# Patient Record
Sex: Female | Born: 1985 | Race: Black or African American | Hispanic: No | Marital: Single | Smoking: Never smoker
Health system: Southern US, Community
[De-identification: ages and names within clinical notes are randomized; demographics above are authoritative.]

## PROBLEM LIST (undated history)

## (undated) HISTORY — PX: TONSILLECTOMY: SUR1361

---

## 2004-02-05 ENCOUNTER — Emergency Department (HOSPITAL_COMMUNITY): Admission: EM | Admit: 2004-02-05 | Discharge: 2004-02-05 | Payer: Self-pay | Admitting: Emergency Medicine

## 2004-07-02 ENCOUNTER — Emergency Department (HOSPITAL_COMMUNITY): Admission: EM | Admit: 2004-07-02 | Discharge: 2004-07-02 | Payer: Self-pay | Admitting: Emergency Medicine

## 2004-08-26 ENCOUNTER — Emergency Department (HOSPITAL_COMMUNITY): Admission: EM | Admit: 2004-08-26 | Discharge: 2004-08-26 | Payer: Self-pay | Admitting: Emergency Medicine

## 2005-10-02 ENCOUNTER — Emergency Department (HOSPITAL_COMMUNITY): Admission: EM | Admit: 2005-10-02 | Discharge: 2005-10-02 | Payer: Self-pay | Admitting: Emergency Medicine

## 2005-10-13 ENCOUNTER — Emergency Department (HOSPITAL_COMMUNITY): Admission: EM | Admit: 2005-10-13 | Discharge: 2005-10-13 | Payer: Self-pay | Admitting: Emergency Medicine

## 2006-03-07 ENCOUNTER — Emergency Department (HOSPITAL_COMMUNITY): Admission: EM | Admit: 2006-03-07 | Discharge: 2006-03-08 | Payer: Self-pay | Admitting: Emergency Medicine

## 2008-08-29 ENCOUNTER — Emergency Department (HOSPITAL_BASED_OUTPATIENT_CLINIC_OR_DEPARTMENT_OTHER): Admission: EM | Admit: 2008-08-29 | Discharge: 2008-08-29 | Payer: Self-pay | Admitting: Emergency Medicine

## 2009-03-06 ENCOUNTER — Emergency Department (HOSPITAL_COMMUNITY): Admission: EM | Admit: 2009-03-06 | Discharge: 2009-03-06 | Payer: Self-pay | Admitting: Emergency Medicine

## 2010-04-02 LAB — POCT PREGNANCY, URINE: Preg Test, Ur: NEGATIVE

## 2010-04-19 LAB — URINALYSIS, ROUTINE W REFLEX MICROSCOPIC
Bilirubin Urine: NEGATIVE
Ketones, ur: 15 mg/dL — AB
Nitrite: NEGATIVE
Protein, ur: 30 mg/dL — AB
Urobilinogen, UA: 1 mg/dL (ref 0.0–1.0)

## 2010-04-19 LAB — URINE MICROSCOPIC-ADD ON

## 2010-04-19 LAB — PREGNANCY, URINE: Preg Test, Ur: NEGATIVE

## 2010-11-17 ENCOUNTER — Emergency Department (INDEPENDENT_AMBULATORY_CARE_PROVIDER_SITE_OTHER): Payer: Medicaid Other

## 2010-11-17 ENCOUNTER — Encounter: Payer: Self-pay | Admitting: *Deleted

## 2010-11-17 ENCOUNTER — Emergency Department (HOSPITAL_BASED_OUTPATIENT_CLINIC_OR_DEPARTMENT_OTHER)
Admission: EM | Admit: 2010-11-17 | Discharge: 2010-11-17 | Disposition: A | Discharge status: Home / Self Care | Payer: Medicaid Other | Attending: Emergency Medicine | Admitting: Emergency Medicine

## 2010-11-17 DIAGNOSIS — W19XXXA Unspecified fall, initial encounter: Secondary | ICD-10-CM

## 2010-11-17 DIAGNOSIS — M546 Pain in thoracic spine: Secondary | ICD-10-CM

## 2010-11-17 DIAGNOSIS — M4120 Other idiopathic scoliosis, site unspecified: Secondary | ICD-10-CM

## 2010-11-17 DIAGNOSIS — M542 Cervicalgia: Secondary | ICD-10-CM

## 2010-11-17 DIAGNOSIS — Y92009 Unspecified place in unspecified non-institutional (private) residence as the place of occurrence of the external cause: Secondary | ICD-10-CM | POA: Insufficient documentation

## 2010-11-17 DIAGNOSIS — S139XXA Sprain of joints and ligaments of unspecified parts of neck, initial encounter: Secondary | ICD-10-CM | POA: Insufficient documentation

## 2010-11-17 DIAGNOSIS — S161XXA Strain of muscle, fascia and tendon at neck level, initial encounter: Secondary | ICD-10-CM

## 2010-11-17 DIAGNOSIS — W108XXA Fall (on) (from) other stairs and steps, initial encounter: Secondary | ICD-10-CM | POA: Insufficient documentation

## 2010-11-17 DIAGNOSIS — M549 Dorsalgia, unspecified: Secondary | ICD-10-CM | POA: Insufficient documentation

## 2010-11-17 MED ORDER — HYDROCODONE-ACETAMINOPHEN 5-500 MG PO TABS: 1.0000 | ORAL_TABLET | Freq: Four times a day (QID) | ORAL | Status: AC | PRN

## 2010-11-17 MED ORDER — CYCLOBENZAPRINE HCL 5 MG PO TABS: 5.0000 mg | ORAL_TABLET | Freq: Two times a day (BID) | ORAL | Status: AC | PRN

## 2010-11-17 MED ORDER — HYDROCODONE-ACETAMINOPHEN 5-325 MG PO TABS
2.0000 | ORAL_TABLET | Freq: Once | ORAL | Status: AC
  Administered 2010-11-17: 2 via ORAL
  Filled 2010-11-17: qty 2

## 2010-11-17 NOTE — ED Provider Notes (Signed)
History     CSN: [id number] Arrival date & time: 11/17/2010  2:39 PM   First MD Initiated Contact with Patient 11/17/10 1452      Chief Complaint  Patient presents with  . Fall  . Back Pain    (Consider location/radiation/quality/duration/timing/severity/associated sxs/prior treatment) Patient is a 25 y.o. female presenting with fall and back pain. The history is provided by the patient. No language interpreter was used.  Fall The accident occurred 3 to 5 hours ago. Incident: fall down flight of stairs. She landed on a hard floor. There was no blood loss. Pain location: upper back. The pain is moderate. She was ambulatory at the scene. There was no entrapment after the fall. There was no drug use involved in the accident. There was no alcohol use involved in the accident. Pertinent negatives include no loss of consciousness and no tingling. The symptoms are aggravated by activity.  Back Pain  Pertinent negatives include no tingling.    History reviewed. No pertinent past medical history.  Past Surgical History  Procedure Date  . Tonsillectomy   . Cesarean section     History reviewed. No pertinent family history.  History  Substance Use Topics  . Smoking status: Never Smoker   . Smokeless tobacco: Not on file  . Alcohol Use: No    OB History    Grav Para Term Preterm Abortions TAB SAB Ect Mult Living                  Review of Systems  Musculoskeletal: Positive for back pain.  Neurological: Negative for tingling and loss of consciousness.  All other systems reviewed and are negative.    Allergies  Review of patient's allergies indicates no known allergies.  Home Medications  No current outpatient prescriptions on file.  BP 150/71  Pulse 82  Temp(Src) 98.1 F (36.7 C) (Oral)  Resp 18  Ht 5\' 4"  (1.626 m)  Wt 230 lb (104.327 kg)  BMI 39.48 kg/m2  SpO2 100%  Physical Exam  Nursing note and vitals reviewed. Constitutional: She is oriented to person,  place, and time. She appears well-developed and well-nourished.  HENT:  Head: Normocephalic and atraumatic.  Right Ear: External ear normal.  Left Ear: External ear normal.  Eyes: Pupils are equal, round, and reactive to light.  Neck: Neck supple.  Cardiovascular: Normal rate and regular rhythm.   Pulmonary/Chest: Effort normal and breath sounds normal.  Abdominal: Soft.  Musculoskeletal:       Cervical back: She exhibits tenderness, bony tenderness and pain.       Thoracic back: She exhibits tenderness and bony tenderness.       Lumbar back: Normal.  Neurological: She is alert and oriented to person, place, and time.  Skin: Skin is warm and dry.  Psychiatric: She has a normal mood and affect.    ED Course  Procedures (including critical care time)  Labs Reviewed - No data to display Dg Cervical Spine Complete  11/17/2010  *RADIOLOGY REPORT*  Clinical Data: 25 year old female status post fall with pain.  CERVICAL SPINE - COMPLETE 4+ VIEW  Comparison: 07/02/2004.  Findings: Chronic straightening of cervical lordosis.  Normal prevertebral soft tissues.  Relatively preserved disc spaces. Cervicothoracic junction alignment is within normal limits. Bilateral posterior element alignment is within normal limits. Stable AP alignment with mild scoliosis.  Lung apices are clear. C1-C2 alignment and odontoid within normal limits.  IMPRESSION: No acute fracture or listhesis identified in the cervical spine. Ligamentous injury  is not excluded.  Original Report Authenticated By: Harley Hallmark, M.D.   Dg Thoracic Spine 4v  11/17/2010  *RADIOLOGY REPORT*  Clinical Data: 25 year old female status post fall with pain.  THORACIC SPINE - 4+ VIEW  Comparison: Chest radiographs 10/13/2005.  Findings: Moderate to s-shaped scoliosis. Bone mineralization is within normal limits.  Normal thoracic segmentation. Cervicothoracic junction alignment is within normal limits.  Stable thoracic vertebral height and  alignment.  Grossly intact posterior ribs.  Grossly negative visualized thoracic visceral contours.  IMPRESSION: Moderate scoliosis. No acute fracture or listhesis identified in the thoracic spine.  Original Report Authenticated By: Harley Hallmark, M.D.     1. Cervical strain   2. Back pain   3. Fall       MDM  Pt not having any neuro deficit:pt to follow up as needed:will treat symptomatically        Teressa Lower, NP 11/17/10 1624

## 2010-11-17 NOTE — ED Notes (Signed)
Pt c/o fall this am injuring upper back

## 2010-11-17 NOTE — ED Provider Notes (Signed)
Medical screening examination/treatment/procedure(s) were performed by non-physician practitioner and as supervising physician I was immediately available for consultation/collaboration.   Edilson Vital, MD 11/17/10 2149 

## 2012-05-15 IMAGING — CR DG THORACIC SPINE 4+V
3 series · 3 of 3 positions shown · non-contrast
Comparison: Chest radiographs 10/13/2005.

CLINICAL DATA: 24-year-old female status post fall with pain.

THORACIC SPINE - 4+ VIEW

[w t-spine a.p. *]
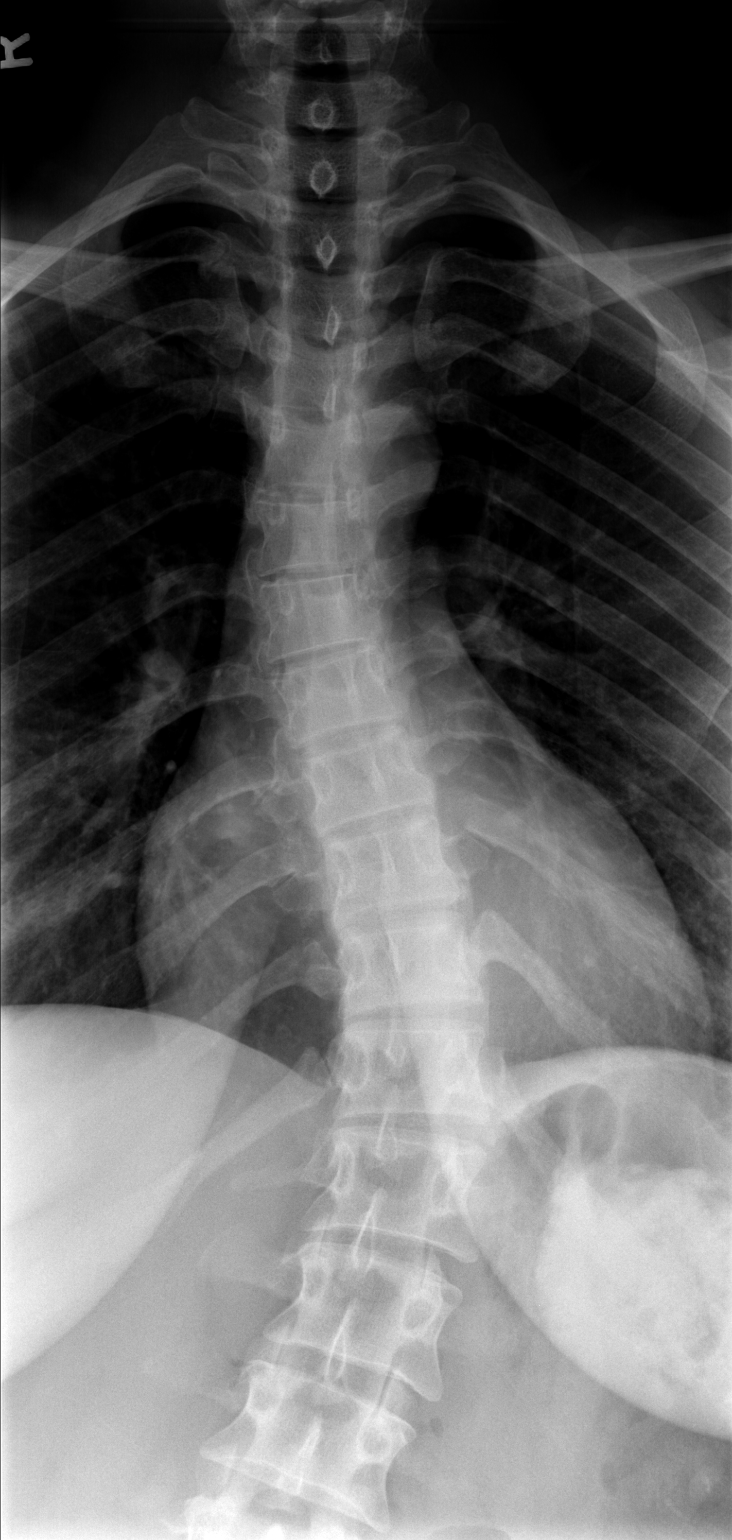

[w t-spine lat *]
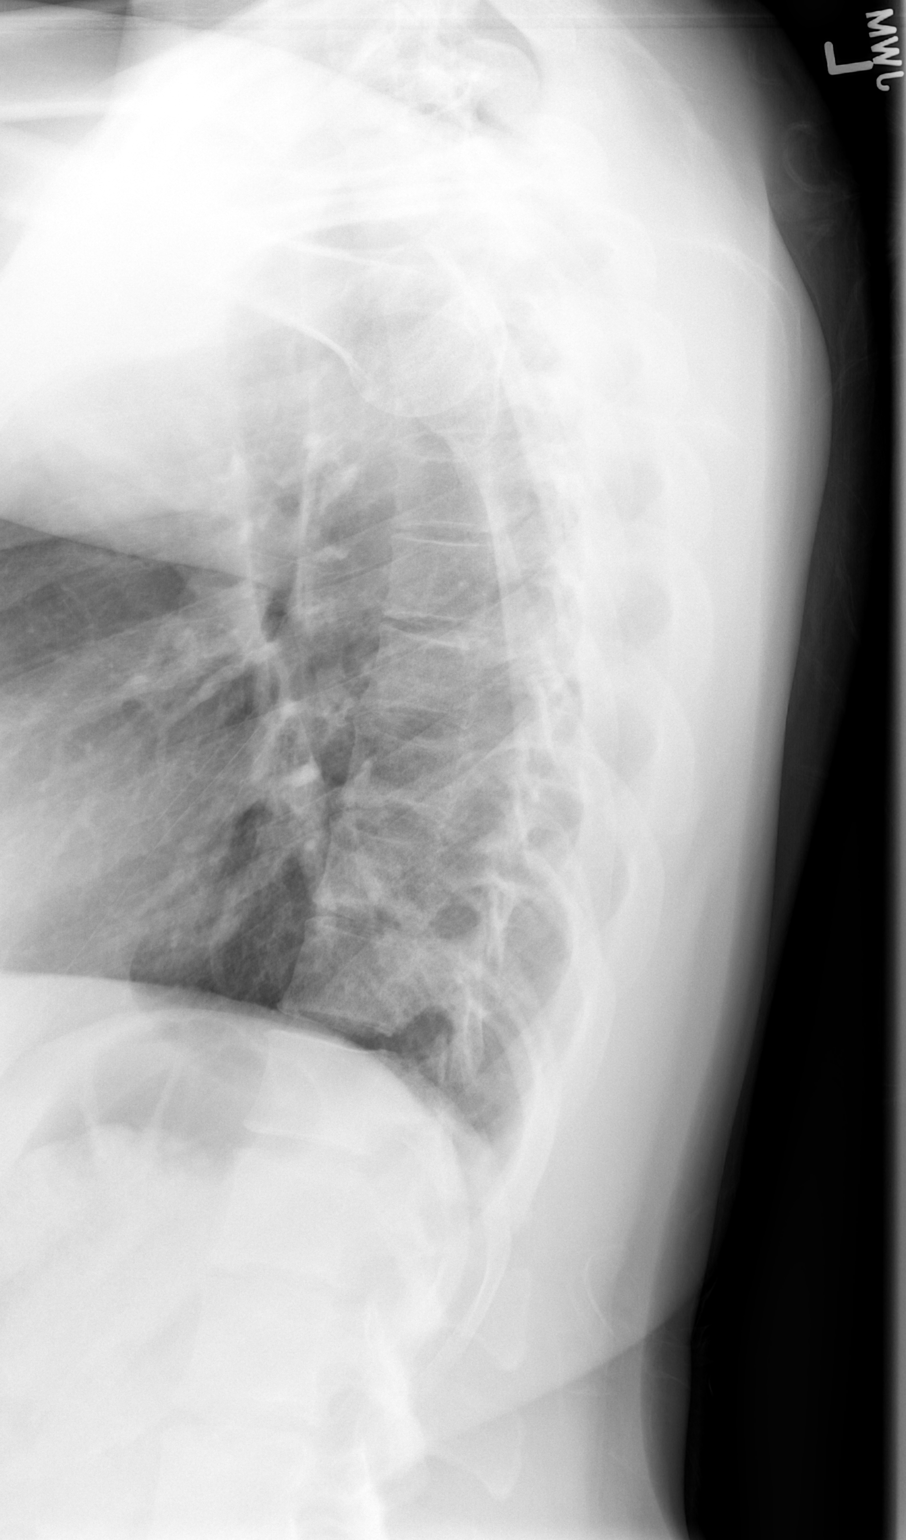

[w swimmers view]
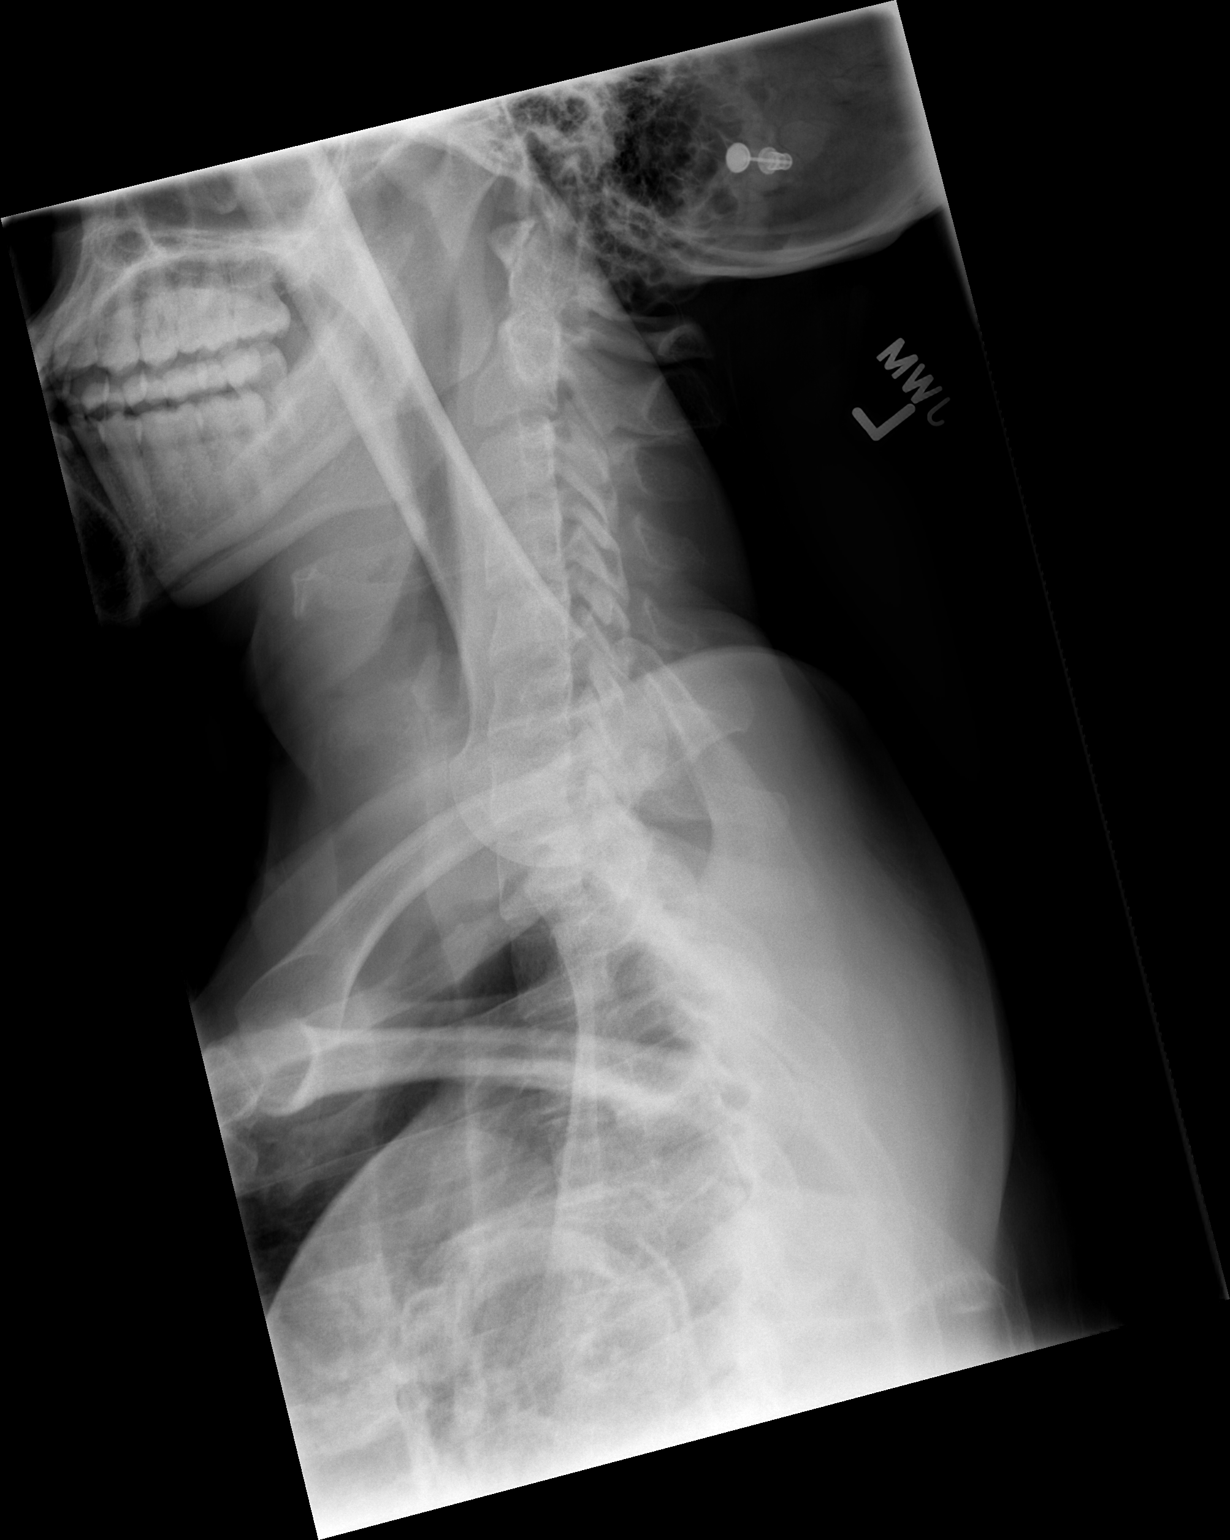

[3 of 3 positions shown; findings below may reference images not displayed]

FINDINGS: Moderate to s-shaped scoliosis. Bone mineralization is
within normal limits.  Normal thoracic segmentation.
Cervicothoracic junction alignment is within normal limits.  Stable
thoracic vertebral height and alignment.  Grossly intact posterior
ribs.  Grossly negative visualized thoracic visceral contours.
IMPRESSION: Moderate scoliosis. No acute fracture or listhesis identified in
the thoracic spine.

## 2019-04-03 ENCOUNTER — Encounter (HOSPITAL_BASED_OUTPATIENT_CLINIC_OR_DEPARTMENT_OTHER): Payer: Self-pay

## 2019-04-03 ENCOUNTER — Emergency Department (HOSPITAL_BASED_OUTPATIENT_CLINIC_OR_DEPARTMENT_OTHER)
Admission: EM | Admit: 2019-04-03 | Discharge: 2019-04-03 | Disposition: A | Discharge status: Home / Self Care | Payer: Self-pay | Attending: Emergency Medicine | Admitting: Emergency Medicine

## 2019-04-03 ENCOUNTER — Other Ambulatory Visit: Payer: Self-pay

## 2019-04-03 DIAGNOSIS — L509 Urticaria, unspecified: Secondary | ICD-10-CM

## 2019-04-03 MED ORDER — DIPHENHYDRAMINE HCL 25 MG PO TABS: 25.0000 mg | ORAL_TABLET | Freq: Four times a day (QID) | ORAL | 0 refills | Status: AC | PRN

## 2019-04-03 MED ORDER — DIPHENHYDRAMINE HCL 25 MG PO CAPS
50.0000 mg | ORAL_CAPSULE | Freq: Once | ORAL | Status: AC
  Administered 2019-04-03: 10:00:00 50 mg via ORAL
  Filled 2019-04-03: qty 2

## 2019-04-03 MED ORDER — PREDNISONE 50 MG PO TABS
60.0000 mg | ORAL_TABLET | Freq: Once | ORAL | Status: AC
  Administered 2019-04-03: 60 mg via ORAL
  Filled 2019-04-03: qty 1

## 2019-04-03 MED ORDER — PREDNISONE 10 MG PO TABS: 40.0000 mg | ORAL_TABLET | Freq: Every day | ORAL | 0 refills | Status: AC

## 2019-04-03 NOTE — Discharge Instructions (Addendum)
Please take Benadryl as needed every 6 hours for the next 3 days.  You may follow-up with a allergist I have given you a phone number to call.

## 2019-04-03 NOTE — ED Provider Notes (Signed)
[facility] EMERGENCY DEPARTMENT Provider Note   CSN: [id number] Arrival date & time: 04/03/19  0940     History Chief Complaint  Patient presents with  . Allergic Reaction    Taylor Brooks is a 34 y.o. female with no significant past medical history  HPI Patient presents today for rash to her bilateral legs, abdomen, back, thorax, neck and arms.  She states rash is itchy, warm to touch.   Patient states that she had a [place] restaurant yesterday which did not eat and poor in the past.  States that she woke up this morning with diffuse rash to the above-mentioned body parts.  Denies any shortness of breath, denies any nausea, vomiting, diarrhea, denies any abdominal pain.  Patient states she has not tried anything prior to arrival.  Denies any history of anaphylaxis or angioedema.  Is not on any ACE inhibitor's.  Has no family history of angioedema.  No history of severe allergies.  No history of allergic rhinitis or aspirin allergies.  Denies any chest pain.  No cough, cold, congestion.  Denies any fevers, chills, wounds or skin lesions with purulence.    History reviewed. No pertinent past medical history.  There are no problems to display for this patient.   Past Surgical History:  Procedure Laterality Date  . CESAREAN SECTION    . TONSILLECTOMY       OB History   No obstetric history on file.     History reviewed. No pertinent family history.  Social History   Tobacco Use  . Smoking status: Never Smoker  Substance Use Topics  . Alcohol use: No  . Drug use: Not on file    Home Medications Prior to Admission medications   Medication Sig Start Date End Date Taking? Authorizing Provider  diphenhydrAMINE (BENADRYL) 25 MG tablet Take 1 tablet (25 mg total) by mouth every 6 (six) hours as needed. 04/03/19   Gailen Shelter, PA  predniSONE (DELTASONE) 10 MG tablet Take 4 tablets (40 mg total) by mouth daily for 5 days. 04/03/19 04/08/19  Gailen Shelter, PA     Allergies    Patient has no known allergies.  Review of Systems   Review of Systems  Constitutional: Negative for fever.  HENT: Negative for congestion.   Respiratory: Negative for shortness of breath.   Cardiovascular: Negative for chest pain.  Gastrointestinal: Negative for abdominal distention.  Skin: Positive for rash.  Neurological: Negative for dizziness and headaches.    Physical Exam Updated Vital Signs BP (!) 150/94 (BP Location: Right Arm)   Pulse 80   Temp 98.2 F (36.8 C) (Oral)   Resp 16   Ht 5\' 5"  (1.651 m)   Wt 104.3 kg   SpO2 100%   BMI 38.27 kg/m   Physical Exam Vitals and nursing note reviewed.  Constitutional:      General: She is not in acute distress.    Appearance: She is not ill-appearing.     Comments: Patient is pleasant, 34 year old female appears stated age.  In no acute distress.  HENT:     Head: Normocephalic and atraumatic.     Nose: Nose normal.     Mouth/Throat:     Mouth: Mucous membranes are moist.     Comments: No pharyngeal erythema or injection.  No swelling of oropharynx Eyes:     General: No scleral icterus. Cardiovascular:     Rate and Rhythm: Normal rate and regular rhythm.     Pulses: Normal pulses.  Heart sounds: Normal heart sounds.  Pulmonary:     Effort: Pulmonary effort is normal. No respiratory distress.     Breath sounds: No wheezing.     Comments: Lungs are clear to auscultation bilaterally.  No wheezing or stridor.  Speaking full sentences without difficulty. Abdominal:     Palpations: Abdomen is soft.     Tenderness: There is no abdominal tenderness.  Musculoskeletal:     Cervical back: Normal range of motion.     Right lower leg: No edema.     Left lower leg: No edema.     Comments: Moves all 4 limbs.  No discernible strength discrepancies.  Skin:    General: Skin is warm and dry.     Capillary Refill: Capillary refill takes less than 2 seconds.     Comments: Diffuse urticarial rash to bilateral  arms, legs, abdomen, chest and back.  Neurological:     Mental Status: She is alert. Mental status is at baseline.  Psychiatric:        Mood and Affect: Mood normal.        Behavior: Behavior normal.     ED Results / Procedures / Treatments   Labs (all labs ordered are listed, but only abnormal results are displayed) Labs Reviewed - No data to display  EKG None  Radiology No results found.  Procedures Procedures (including critical care time)  Medications Ordered in ED Medications  predniSONE (DELTASONE) tablet 60 mg (60 mg Oral Given 04/03/19 1014)  diphenhydrAMINE (BENADRYL) capsule 50 mg (50 mg Oral Given 04/03/19 1014)    ED Course  I have reviewed the triage vital signs and the nursing notes.  Pertinent labs & imaging results that were available during my care of the patient were reviewed by me and considered in my medical decision making (see chart for details).    MDM Rules/Calculators/A&P                      Patient with hives after eating a [employer] yesterday.  Has no history of allergic reactions.  No anaphylactic history or angioedema history.  Is not ACE inhibitor.  She has diffuse hives on her lower extremities, upper extremities, chest, neck, back, thorax.  She has no symptoms of anaphylaxis today other than her hives.  No wheezing, nausea, vomiting, diarrhea, abdominal pain.  She is vital signs within normal limits apart from mild hypertension.  Patient given 1 dose of prednisone and 1 dose of Benadryl.  Patient significantly improved after 1 hour of observation.  States she feels better.  Is comfortable discharge at this time will return to ED if using new or concerning symptoms.  She will follow up with allergist.  She will be discharged with p.o. prednisone and Benadryl every 6 hours as needed.  Discussed with patient return precautions.   ----------------------- The medical records were personally reviewed by myself. I personally reviewed  all lab results and interpreted all imaging studies and either concurred with their official read or contacted radiology for clarification.   This patient appears reasonably screened and I doubt any other medical condition requiring further workup, evaluation, or treatment in the ED at this time prior to discharge.   Patient's vitals are WNL apart from vital sign abnormalities discussed above, patient is in NAD, and able to ambulate in the ED at their baseline and able to tolerate PO.  Pain has been managed or a plan has been made for home management and  has no complaints prior to discharge. Patient is comfortable with above plan and for discharge at this time. All questions were answered prior to disposition. Results from the ER workup discussed with the patient face to face and all questions answered to the best of my ability. The patient is safe for discharge with strict return precautions. Patient appears safe for discharge with appropriate follow-up. Conveyed my impression with the patient and they voiced understanding and are agreeable to plan.   An After Visit Summary was printed and given to the patient.  Portions of this note were generated with Scientist, clinical (histocompatibility and immunogenetics). Dictation errors may occur despite best attempts at proofreading.     Final Clinical Impression(s) / ED Diagnoses Final diagnoses:  Hives    Rx / DC Orders ED Discharge Orders         Ordered    predniSONE (DELTASONE) 10 MG tablet  Daily     04/03/19 1121    diphenhydrAMINE (BENADRYL) 25 MG tablet  Every 6 hours PRN     04/03/19 1121           Solon Augusta [place], [place] 04/03/19 1124    Milagros Loll, MD 04/05/19 [phone]

## 2019-04-03 NOTE — ED Notes (Signed)
No worsening of symptoms.  Hives slightly improved, pt continues to report itching.

## 2019-04-03 NOTE — ED Notes (Signed)
ED Provider at bedside. 

## 2019-04-03 NOTE — ED Triage Notes (Signed)
Pt awoke this morning noticed hives throughout exremities/torso. Swelling right instep. Does not recall contact with known allergen, does not have any allergies to her knowledge.  Ate at a new [employer] last night.  No difficulty breathing or swallowing.

## 2019-04-27 ENCOUNTER — Ambulatory Visit: Payer: Self-pay | Admitting: Allergy and Immunology

## 2019-05-18 ENCOUNTER — Ambulatory Visit: Payer: Self-pay | Admitting: Allergy and Immunology

## 2019-06-28 ENCOUNTER — Ambulatory Visit: Payer: Self-pay | Admitting: Allergy and Immunology
